# Patient Record
Sex: Female | Born: 1965 | Race: White | Hispanic: No | Marital: Married | Smoking: Never smoker
Health system: Southern US, Community
[De-identification: ages and names within clinical notes are randomized; demographics above are authoritative.]

## PROBLEM LIST (undated history)

## (undated) DIAGNOSIS — C801 Malignant (primary) neoplasm, unspecified: Secondary | ICD-10-CM

---

## 2011-02-22 ENCOUNTER — Ambulatory Visit
Admission: RE | Admit: 2011-02-22 | Discharge: 2011-02-22 | Disposition: A | Discharge status: Home / Self Care | Payer: BC Managed Care – PPO | Source: Ambulatory Visit | Attending: Sports Medicine | Admitting: Sports Medicine

## 2011-02-22 ENCOUNTER — Other Ambulatory Visit: Payer: Self-pay | Admitting: Sports Medicine

## 2011-02-22 DIAGNOSIS — M542 Cervicalgia: Secondary | ICD-10-CM

## 2011-02-22 DIAGNOSIS — IMO0002 Reserved for concepts with insufficient information to code with codable children: Secondary | ICD-10-CM

## 2011-03-09 ENCOUNTER — Other Ambulatory Visit: Payer: Self-pay | Admitting: Sports Medicine

## 2011-03-09 DIAGNOSIS — E041 Nontoxic single thyroid nodule: Secondary | ICD-10-CM

## 2011-03-15 ENCOUNTER — Ambulatory Visit
Admission: RE | Admit: 2011-03-15 | Discharge: 2011-03-15 | Disposition: A | Discharge status: Home / Self Care | Payer: BC Managed Care – PPO | Source: Ambulatory Visit | Attending: Sports Medicine | Admitting: Sports Medicine

## 2011-03-15 DIAGNOSIS — E041 Nontoxic single thyroid nodule: Secondary | ICD-10-CM

## 2012-06-21 ENCOUNTER — Other Ambulatory Visit: Payer: Self-pay | Admitting: Sports Medicine

## 2012-06-21 DIAGNOSIS — M5126 Other intervertebral disc displacement, lumbar region: Secondary | ICD-10-CM

## 2012-06-25 ENCOUNTER — Other Ambulatory Visit: Payer: Self-pay | Admitting: Orthopedic Surgery

## 2012-06-25 DIAGNOSIS — M5126 Other intervertebral disc displacement, lumbar region: Secondary | ICD-10-CM

## 2012-06-26 ENCOUNTER — Ambulatory Visit
Admission: RE | Admit: 2012-06-26 | Discharge: 2012-06-26 | Disposition: A | Discharge status: Home / Self Care | Payer: BC Managed Care – PPO | Source: Ambulatory Visit | Attending: Orthopedic Surgery | Admitting: Orthopedic Surgery

## 2012-06-26 DIAGNOSIS — M5126 Other intervertebral disc displacement, lumbar region: Secondary | ICD-10-CM

## 2013-06-23 ENCOUNTER — Other Ambulatory Visit: Payer: Self-pay | Admitting: Physical Medicine and Rehabilitation

## 2013-06-23 DIAGNOSIS — M545 Low back pain, unspecified: Secondary | ICD-10-CM

## 2013-06-23 DIAGNOSIS — M542 Cervicalgia: Secondary | ICD-10-CM

## 2013-06-25 ENCOUNTER — Other Ambulatory Visit: Payer: BC Managed Care – PPO

## 2013-06-25 ENCOUNTER — Ambulatory Visit
Admission: RE | Admit: 2013-06-25 | Discharge: 2013-06-25 | Disposition: A | Discharge status: Home / Self Care | Payer: BC Managed Care – PPO | Source: Ambulatory Visit | Attending: Physical Medicine and Rehabilitation | Admitting: Physical Medicine and Rehabilitation

## 2013-06-25 VITALS — BP 140/85 | HR 63

## 2013-06-25 DIAGNOSIS — M542 Cervicalgia: Secondary | ICD-10-CM

## 2013-06-25 MED ORDER — TRIAMCINOLONE ACETONIDE 40 MG/ML IJ SUSP (RADIOLOGY)
60.0000 mg | Freq: Once | INTRAMUSCULAR | Status: AC
  Administered 2013-06-25: 60 mg via EPIDURAL

## 2013-06-25 MED ORDER — IOHEXOL 300 MG/ML  SOLN
1.0000 mL | Freq: Once | INTRAMUSCULAR | Status: AC | PRN
  Administered 2013-06-25: 1 mL via EPIDURAL

## 2013-12-25 ENCOUNTER — Other Ambulatory Visit: Payer: Self-pay | Admitting: Orthopedic Surgery

## 2013-12-25 DIAGNOSIS — M542 Cervicalgia: Secondary | ICD-10-CM

## 2014-01-05 ENCOUNTER — Ambulatory Visit
Admission: RE | Admit: 2014-01-05 | Discharge: 2014-01-05 | Disposition: A | Discharge status: Home / Self Care | Payer: BC Managed Care – PPO | Source: Ambulatory Visit | Attending: Orthopedic Surgery | Admitting: Orthopedic Surgery

## 2014-01-05 DIAGNOSIS — M542 Cervicalgia: Secondary | ICD-10-CM

## 2014-01-05 MED ORDER — DEXAMETHASONE SODIUM PHOSPHATE 4 MG/ML IJ SOLN
4.0000 mg | Freq: Once | INTRAMUSCULAR | Status: AC
  Administered 2014-01-05: 5 mg

## 2017-02-06 ENCOUNTER — Emergency Department (HOSPITAL_COMMUNITY): Payer: BLUE CROSS/BLUE SHIELD

## 2017-02-06 ENCOUNTER — Emergency Department (HOSPITAL_COMMUNITY)
Admission: EM | Admit: 2017-02-06 | Discharge: 2017-02-06 | Disposition: A | Discharge status: Home / Self Care | Payer: BLUE CROSS/BLUE SHIELD | Attending: Emergency Medicine | Admitting: Emergency Medicine

## 2017-02-06 ENCOUNTER — Encounter (HOSPITAL_COMMUNITY): Payer: Self-pay | Admitting: Emergency Medicine

## 2017-02-06 DIAGNOSIS — R319 Hematuria, unspecified: Secondary | ICD-10-CM | POA: Diagnosis present

## 2017-02-06 DIAGNOSIS — N132 Hydronephrosis with renal and ureteral calculous obstruction: Secondary | ICD-10-CM | POA: Insufficient documentation

## 2017-02-06 DIAGNOSIS — M545 Low back pain: Secondary | ICD-10-CM | POA: Diagnosis not present

## 2017-02-06 DIAGNOSIS — Z79899 Other long term (current) drug therapy: Secondary | ICD-10-CM | POA: Diagnosis not present

## 2017-02-06 DIAGNOSIS — N23 Unspecified renal colic: Secondary | ICD-10-CM

## 2017-02-06 DIAGNOSIS — I10 Essential (primary) hypertension: Secondary | ICD-10-CM | POA: Diagnosis not present

## 2017-02-06 DIAGNOSIS — N200 Calculus of kidney: Secondary | ICD-10-CM

## 2017-02-06 HISTORY — DX: Malignant (primary) neoplasm, unspecified: C80.1

## 2017-02-06 LAB — COMPREHENSIVE METABOLIC PANEL
ALT: 16 U/L (ref 14–54)
ANION GAP: 10 (ref 5–15)
AST: 20 U/L (ref 15–41)
Albumin: 3.8 g/dL (ref 3.5–5.0)
Alkaline Phosphatase: 66 U/L (ref 38–126)
BUN: 16 mg/dL (ref 6–20)
CALCIUM: 9.1 mg/dL (ref 8.9–10.3)
CHLORIDE: 105 mmol/L (ref 101–111)
CO2: 23 mmol/L (ref 22–32)
CREATININE: 1.07 mg/dL — AB (ref 0.44–1.00)
Glucose, Bld: 109 mg/dL — ABNORMAL HIGH (ref 65–99)
Potassium: 3.9 mmol/L (ref 3.5–5.1)
Sodium: 138 mmol/L (ref 135–145)
Total Bilirubin: 0.6 mg/dL (ref 0.3–1.2)
Total Protein: 7.4 g/dL (ref 6.5–8.1)

## 2017-02-06 LAB — POC URINE PREG, ED: Preg Test, Ur: NEGATIVE

## 2017-02-06 LAB — CBC
HEMATOCRIT: 44.7 % (ref 36.0–46.0)
Hemoglobin: 15.3 g/dL — ABNORMAL HIGH (ref 12.0–15.0)
MCH: 31.7 pg (ref 26.0–34.0)
MCHC: 34.2 g/dL (ref 30.0–36.0)
MCV: 92.5 fL (ref 78.0–100.0)
Platelets: 244 10*3/uL (ref 150–400)
RBC: 4.83 MIL/uL (ref 3.87–5.11)
RDW: 12.5 % (ref 11.5–15.5)
WBC: 7.2 10*3/uL (ref 4.0–10.5)

## 2017-02-06 LAB — URINALYSIS, ROUTINE W REFLEX MICROSCOPIC
Bilirubin Urine: NEGATIVE
GLUCOSE, UA: NEGATIVE mg/dL
Ketones, ur: NEGATIVE mg/dL
LEUKOCYTES UA: NEGATIVE
NITRITE: NEGATIVE
Protein, ur: 30 mg/dL — AB
SPECIFIC GRAVITY, URINE: 1.017 (ref 1.005–1.030)
pH: 5 (ref 5.0–8.0)

## 2017-02-06 MED ORDER — TAMSULOSIN HCL 0.4 MG PO CAPS: ORAL_CAPSULE | ORAL | 0 refills | Status: AC

## 2017-02-06 MED ORDER — HYDROCODONE-ACETAMINOPHEN 5-325 MG PO TABS: 1.0000 | ORAL_TABLET | ORAL | 0 refills | Status: AC | PRN

## 2017-02-06 NOTE — ED Triage Notes (Signed)
State has had sever lower abd pain x week and area is ore and she has had bloody urine for same amt of time , states got back from trip to [place] in late jan

## 2017-02-06 NOTE — ED Notes (Signed)
Patient transported to CT 

## 2017-02-06 NOTE — Discharge Instructions (Signed)
Strain the urine to catch, and save the stone.  Do not drive after taking the pain pill. Ibuprofen can be a good pain reliever.  See your PCP about a blood pressure check in 2-3 weeks.

## 2017-02-06 NOTE — ED Notes (Signed)
Pt returned to room from CT

## 2017-02-06 NOTE — ED Provider Notes (Signed)
[facility] DEPT Provider Note   CSN: [id number] Arrival date & time: 02/06/17  1222   By signing my name below, I, Soijett Blue, attest that this documentation has been prepared under the direction and in the presence of Daleen Bo, MD. Electronically Signed: [facility], ED Scribe. 02/06/17. 1:54 PM.  History   Chief Complaint Chief Complaint  Patient presents with  . Hematuria    HPI Leah Martinez is a 51 y.o. female  who presents to the Emergency Department complaining of blood in urine onset this morning. She notes that this morning following urination, she noticed blood on the tissue with wiping and in the toilet. She states that she has urinated 4-5 times today. Pt reports associated lower abdominal pain x 1 week and right lower back pain. Pt has not tried any medications for the relief of her symptoms. She notes that she had taken a cycle class 1 week ago and went hiking in [place] 6 weeks ago prior to the onset of her symptoms. She states that she went to [place] 6 weeks ago. Denies dysuria, frequency, urgency, fever, vomiting, and any other symptoms. Denies having menstrual periods in several years, but denies going through menopause. She states that she has an upcoming appointment with her GYN specialist where they will discuss nuvaring removal and menopause.   The history is provided by the patient. No language interpreter was used.    Past Medical History:  Diagnosis Date  . Cancer ([place])     There are no active problems to display for this patient.   History reviewed. No pertinent surgical history.  OB History    No data available       Home Medications    Prior to Admission medications   Medication Sig Start Date End Date Taking? Authorizing Provider  HYDROcodone-acetaminophen (NORCO) 5-325 MG tablet Take 1 tablet by mouth every 4 (four) hours as needed for moderate pain. 02/06/17   Daleen Bo, MD  tamsulosin [facility]) 0.4 MG CAPS capsule 1 q  HS to aid stone passage 02/06/17   Daleen Bo, MD    Family History No family history on file.  Social History Social History  Substance Use Topics  . Smoking status: Never Smoker  . Smokeless tobacco: Never Used  . Alcohol use Not on file     Allergies   Patient has no allergy information on record.   Review of Systems Review of Systems  Constitutional: Negative for fever.  Gastrointestinal: Positive for abdominal pain (lower). Negative for vomiting.  Genitourinary: Positive for hematuria. Negative for dysuria, frequency and urgency.  Musculoskeletal: Positive for back pain (right lower).  All other systems reviewed and are negative.    Physical Exam Updated Vital Signs BP (!) 159/114 (BP Location: Right Arm)   Pulse 84   Temp 97.9 F (36.6 C)   Resp 14   SpO2 99%   Physical Exam  Constitutional: She is oriented to person, place, and time. She appears well-developed and well-nourished.  HENT:  Head: Normocephalic and atraumatic.  Eyes: Conjunctivae and EOM are normal. Pupils are equal, round, and reactive to light.  Neck: Normal range of motion and phonation normal. Neck supple.  Cardiovascular: Normal rate, regular rhythm and normal heart sounds.  Exam reveals no gallop and no friction rub.   No murmur heard. Pulmonary/Chest: Effort normal and breath sounds normal. No respiratory distress. She has no wheezes. She has no rales. She exhibits no tenderness.  Abdominal: Soft. She exhibits no distension.  There is tenderness in the suprapubic area. There is no guarding and no CVA tenderness.  Moderate diffuse lower abdomen tenderness increased in midline suprapubic region. No CVA tenderness  Musculoskeletal: Normal range of motion.       Lumbar back: She exhibits tenderness. She exhibits normal range of motion.  Tenderness to right lumbar paraspinal muscles with nl ROM.   Neurological: She is alert and oriented to person, place, and time. She exhibits normal muscle  tone.  Skin: Skin is warm and dry.  Psychiatric: She has a normal mood and affect. Her behavior is normal. Judgment and thought content normal.  Nursing note and vitals reviewed.    ED Treatments / Results  DIAGNOSTIC STUDIES: Oxygen Saturation is 97% on RA, nl by my interpretation.    COORDINATION OF CARE: 1:51 PM Discussed treatment plan with pt at bedside which includes labs, UA, CT renal stone study, and pt agreed to plan.   Labs (all labs ordered are listed, but only abnormal results are displayed) Labs Reviewed  COMPREHENSIVE METABOLIC PANEL - Abnormal; Notable for the following:       Result Value   Glucose, Bld 109 (*)    Creatinine, Ser 1.07 (*)    All other components within normal limits  URINALYSIS, ROUTINE W REFLEX MICROSCOPIC - Abnormal; Notable for the following:    APPearance HAZY (*)    Hgb urine dipstick LARGE (*)    Protein, ur 30 (*)    Bacteria, UA RARE (*)    Squamous Epithelial / LPF 0-5 (*)    All other components within normal limits  CBC - Abnormal; Notable for the following:    Hemoglobin 15.3 (*)    All other components within normal limits  URINE CULTURE  POC URINE PREG, ED    Radiology Ct Renal Stone Study  Result Date: 02/06/2017 CLINICAL DATA:  51 y/o  F; pain and hematuria. EXAM: CT ABDOMEN AND PELVIS WITHOUT CONTRAST TECHNIQUE: Multidetector CT imaging of the abdomen and pelvis was performed following the standard protocol without IV contrast. COMPARISON:  06/26/2012 pelvic CT. FINDINGS: Lower chest: No acute abnormality. Hepatobiliary: No focal liver abnormality is seen. No gallstones, gallbladder wall thickening, or biliary dilatation. Pancreas: Unremarkable. No pancreatic ductal dilatation or surrounding inflammatory changes. Spleen: Normal in size without focal abnormality. Adrenals/Urinary Tract: Mild right hydronephrosis secondary to an obstructing stone in the distal ureter just proximal to the right ureterovesicular junction measuring  4 x 3 x 5 mm (AP x ML x CC series 3, image 78 and series 6, image 48). No additional urinary stone disease is identified. Normal left kidney. Normal adrenal glands. No left hydronephrosis. Normal bladder. Stomach/Bowel: Stomach is within normal limits. Appendix appears normal. No evidence of bowel wall thickening, distention, or inflammatory changes. Vascular/Lymphatic: No significant vascular findings are present. No enlarged abdominal or pelvic lymph nodes. Reproductive: Uterus and bilateral adnexa are unremarkable. Other: No abdominal wall hernia or abnormality. No abdominopelvic ascites. Musculoskeletal: No acute or significant osseous findings. IMPRESSION: Mild right hydronephrosis secondary to an obstructing stone in the right distal ureter just proximal to the right ureterovesicular junction measuring up to 5 mm. Electronically Signed   By: Kristine Garbe M.D.   On: 02/06/2017 14:57    Procedures Procedures (including critical care time)  Medications Ordered in ED Medications - No data to display   Initial Impression / Assessment and Plan / ED Course  I have reviewed the triage vital signs and the nursing notes.  Pertinent labs &  imaging results that were available during my care of the patient were reviewed by me and considered in my medical decision making (see chart for details).     Medications - No data to display  No data found.  At discharge- reevaluation with update and discussion. After initial assessment and treatment, an updated evaluation reveals she is comfortable.  Findings discussed with the patient and all questions were. Genelle Economou L    Final Clinical Impressions(s) / ED Diagnoses   Final diagnoses:  Ureter colic  Kidney stone  Hypertension, unspecified type    Right flank pain related to ureteral colic secondary to a small distal stone.  The patient is stable for discharge with outpatient treatment.  Nursing Notes Reviewed/ Care  Coordinated Applicable Imaging Reviewed Interpretation of Laboratory Data incorporated into ED treatment  The patient appears reasonably screened and/or stabilized for discharge and I doubt any other medical condition or other [facility] requiring further screening, evaluation, or treatment in the ED at this time prior to discharge.  Plan: Home Medications-resume usual medications; Home Treatments-strain urine to catch the stone; return here if the recommended treatment, does not improve the symptoms; Recommended follow up-PCP follow-up as needed.  Urology follow-up 1 week.   New Prescriptions Discharge Medication List as of 02/06/2017  3:21 PM    START taking these medications   Details  HYDROcodone-acetaminophen (NORCO) 5-325 MG tablet Take 1 tablet by mouth every 4 (four) hours as needed for moderate pain., Starting Tue 02/06/2017, Print    tamsulosin (FLOMAX) 0.4 MG CAPS capsule 1 q HS to aid stone passage, Print       I personally performed the services described in this documentation, which was scribed in my presence. The recorded information has been reviewed and is accurate.     Daleen Bo, MD 02/08/17 [phone]

## 2017-02-06 NOTE — ED Notes (Signed)
Pt stable, understands discharge instructions, and reasons for return.   

## 2017-02-07 LAB — URINE CULTURE: CULTURE: NO GROWTH

## 2018-05-18 IMAGING — CT CT RENAL STONE PROTOCOL
2 of 4 series · 17 of 46 positions shown, 19 images · non-contrast
Comparison: 06/26/2012 pelvic CT.

CLINICAL DATA: 50 y/o  F; pain and hematuria.

EXAM:
CT ABDOMEN AND PELVIS WITHOUT CONTRAST
TECHNIQUE: Multidetector CT imaging of the abdomen and pelvis was performed
following the standard protocol without IV contrast.

[Series 3: stone study 5.0 i30f 2 · axial · 0.84mm/px · z∈[-837,-397]mm · 14 of 98 slices shown, 16 images]
[im 5/98  soft-tissue]
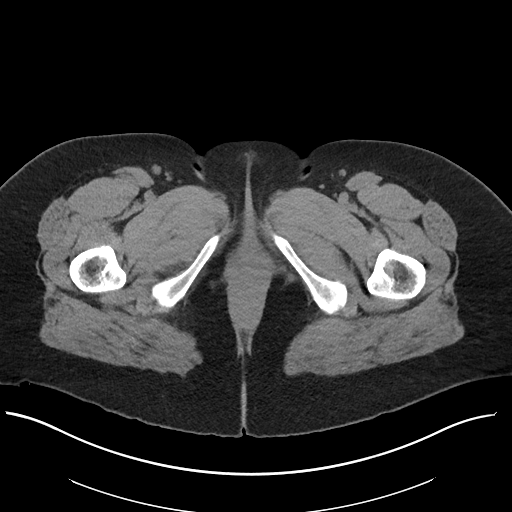
[im 5/98  bone]
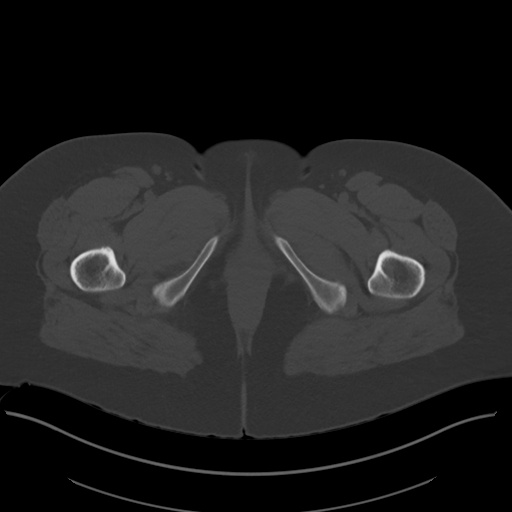
[im 13/98  soft-tissue]
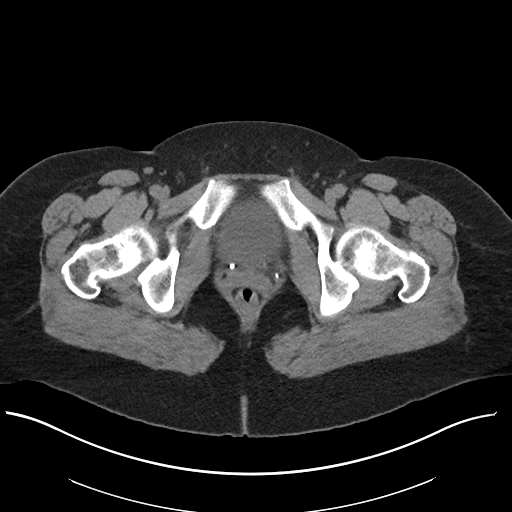
[im 21/98  soft-tissue]
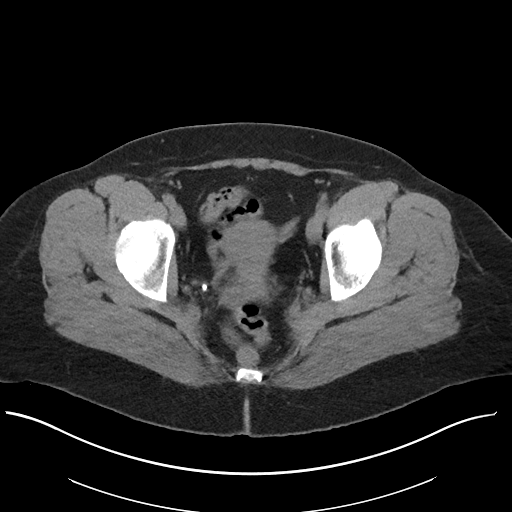
[im 25/98  soft-tissue]
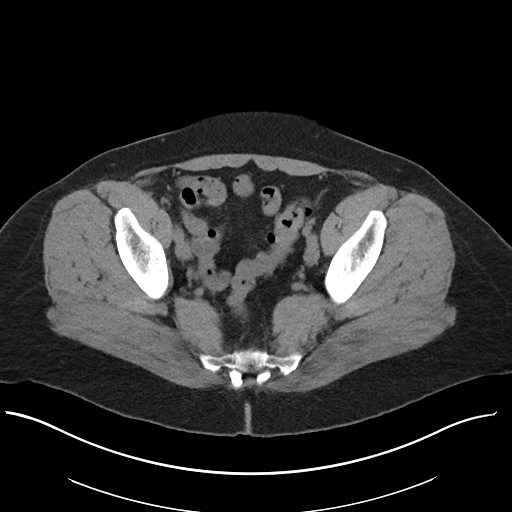
[im 33/98  soft-tissue]
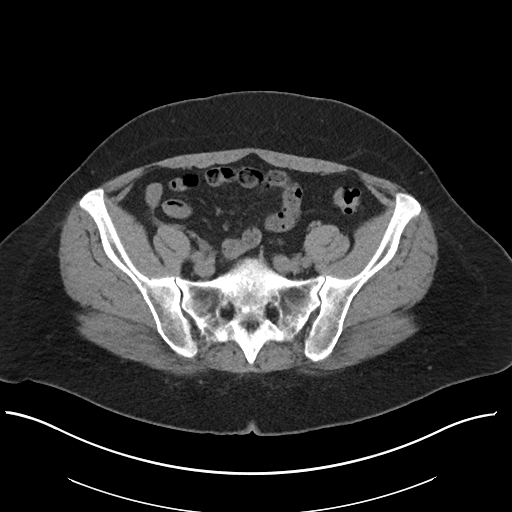
[im 41/98  soft-tissue]
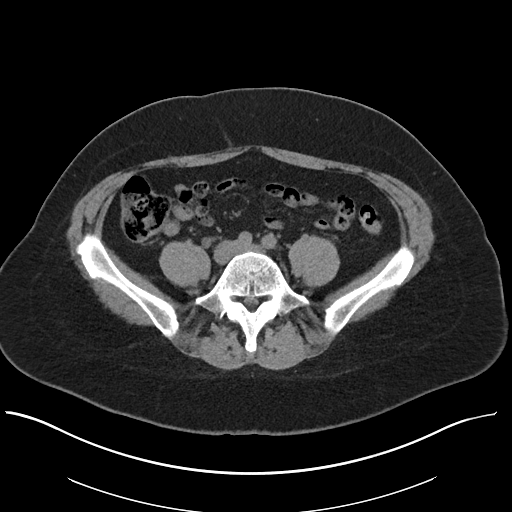
[im 45/98  soft-tissue]
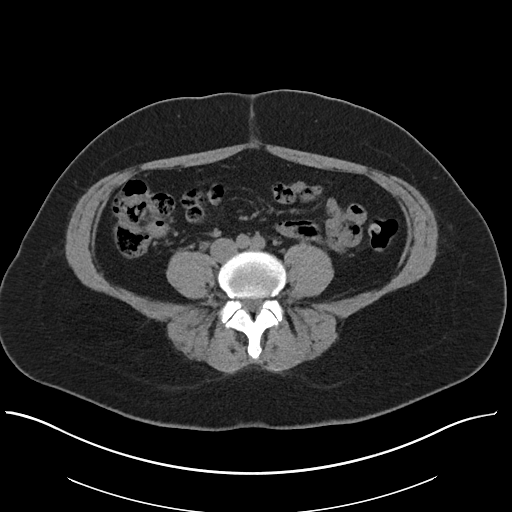
[im 53/98  soft-tissue]
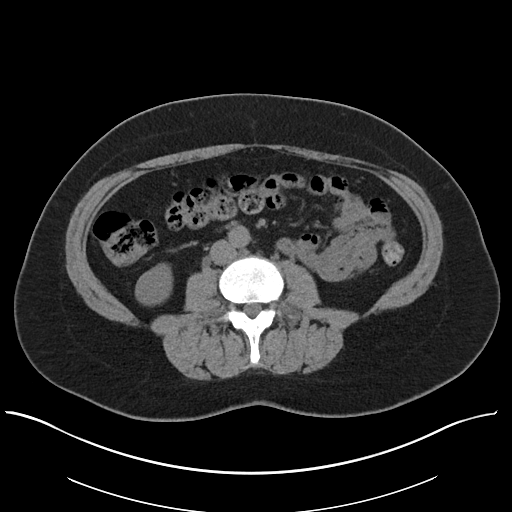
[im 57/98  soft-tissue]
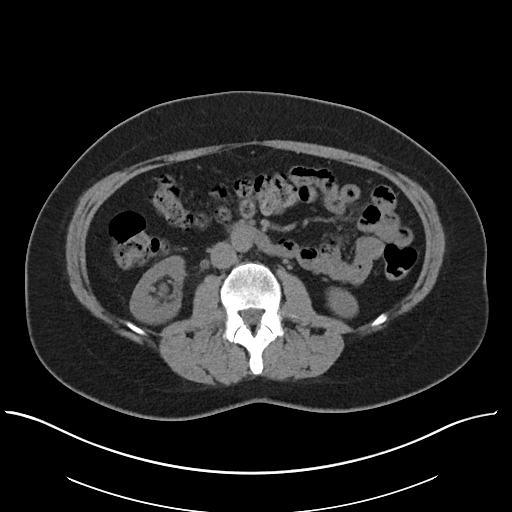
[im 57/98  bone]
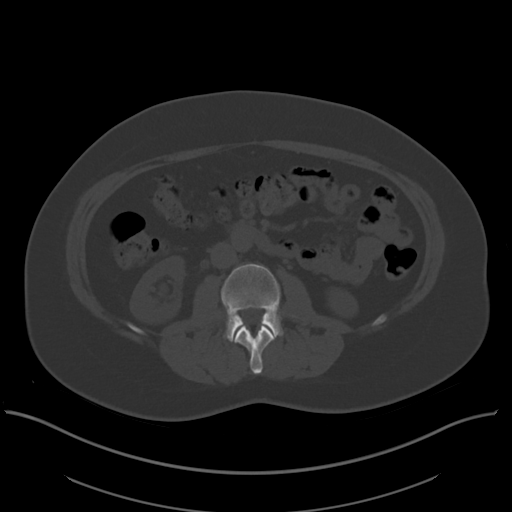
[im 65/98  soft-tissue]
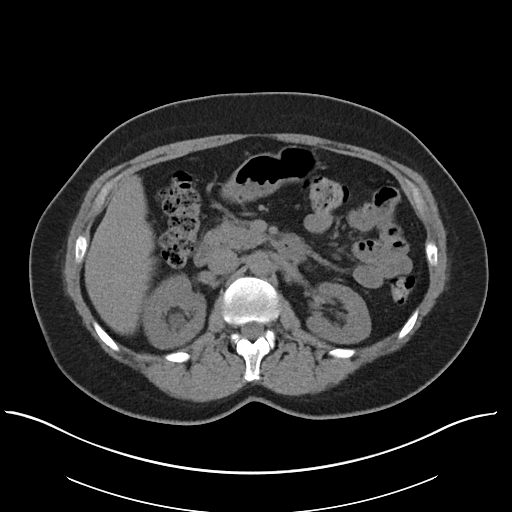
[im 73/98  soft-tissue]
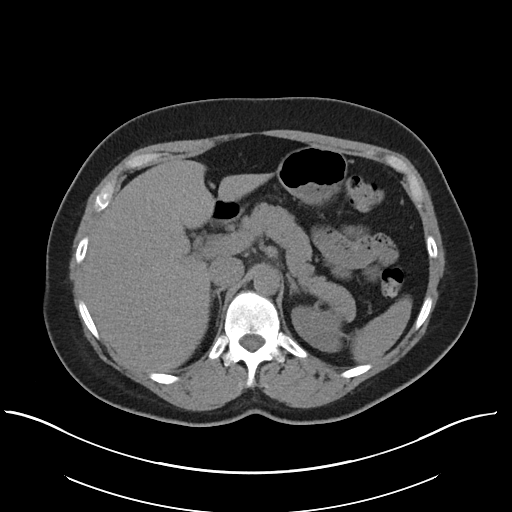
[im 77/98  soft-tissue]
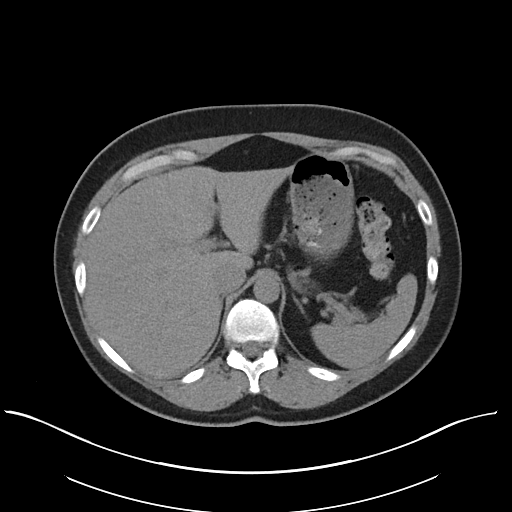
[im 85/98  soft-tissue]
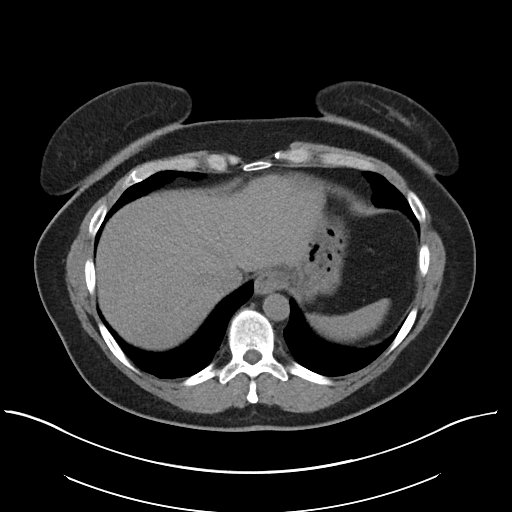
[im 93/98  soft-tissue]
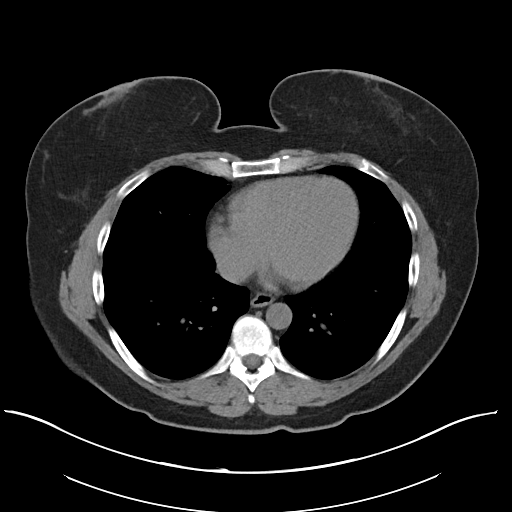

[Series 6: coronal soft tissue · coronal · 0.94mm/px · 3 of 76 slices shown]
[im 26/76  soft-tissue]
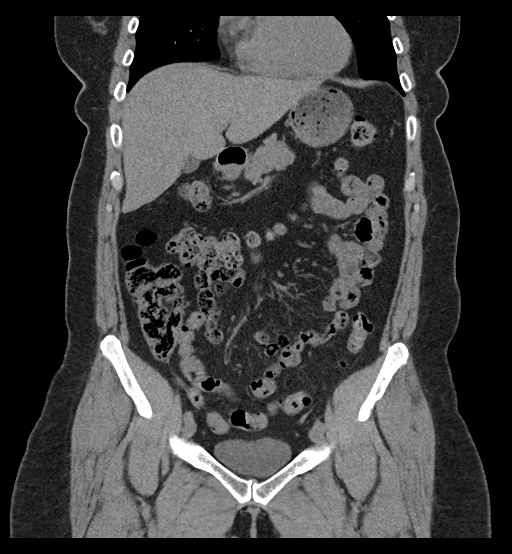
[im 34/76  soft-tissue]
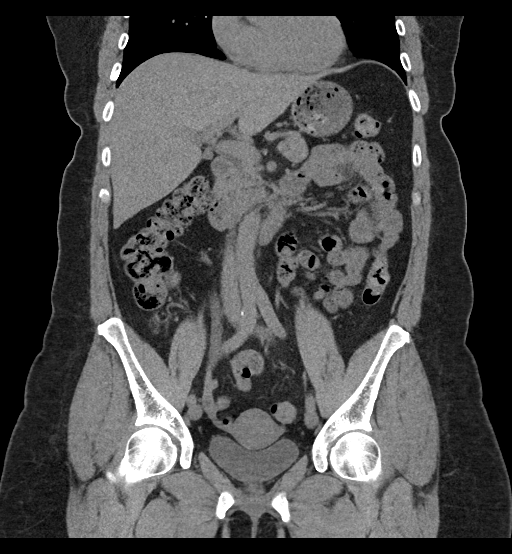
[im 42/76  soft-tissue]
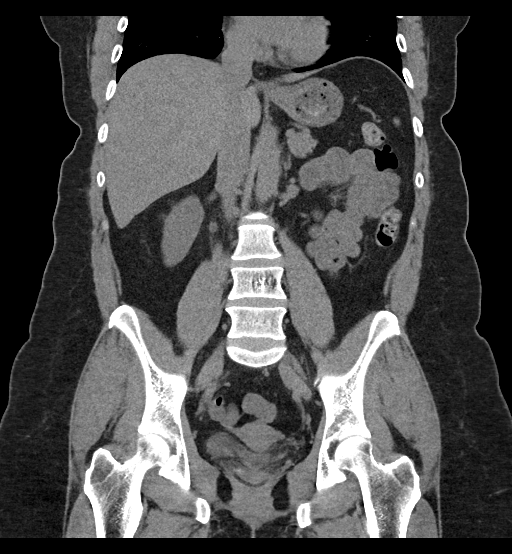

[17 of 46 positions shown; findings below may reference images not displayed]

FINDINGS: Lower chest: No acute abnormality.

Hepatobiliary: No focal liver abnormality is seen. No gallstones,
gallbladder wall thickening, or biliary dilatation.

Pancreas: Unremarkable. No pancreatic ductal dilatation or
surrounding inflammatory changes.

Spleen: Normal in size without focal abnormality.

Adrenals/Urinary Tract: Mild right hydronephrosis secondary to an
obstructing stone in the distal ureter just proximal to the right
ureterovesicular junction measuring 4 x 3 x 5 mm (AP x ML x CC
series 3, image 78 and series 6, image 48).

No additional urinary stone disease is identified. Normal left
kidney. Normal adrenal glands. No left hydronephrosis. Normal
bladder.

Stomach/Bowel: Stomach is within normal limits. Appendix appears
normal. No evidence of bowel wall thickening, distention, or
inflammatory changes.

Vascular/Lymphatic: No significant vascular findings are present. No
enlarged abdominal or pelvic lymph nodes.

Reproductive: Uterus and bilateral adnexa are unremarkable.

Other: No abdominal wall hernia or abnormality. No abdominopelvic
ascites.

Musculoskeletal: No acute or significant osseous findings.
IMPRESSION: Mild right hydronephrosis secondary to an obstructing stone in the
right distal ureter just proximal to the right ureterovesicular
junction measuring up to 5 mm.

By: Viljami Fagerudd M.D.
